# Patient Record
Sex: Male | Born: 1955 | Race: Black or African American | Hispanic: No | Marital: Married | Smoking: Never smoker
Health system: Southern US, Community
[De-identification: ages and names within clinical notes are randomized; demographics above are authoritative.]

## PROBLEM LIST (undated history)

## (undated) DIAGNOSIS — E785 Hyperlipidemia, unspecified: Secondary | ICD-10-CM

## (undated) HISTORY — PX: ROTATOR CUFF REPAIR: SHX139

---

## 2001-05-10 ENCOUNTER — Ambulatory Visit (HOSPITAL_COMMUNITY): Admission: RE | Admit: 2001-05-10 | Discharge: 2001-05-10 | Payer: Self-pay | Admitting: Gastroenterology

## 2006-06-06 ENCOUNTER — Ambulatory Visit (HOSPITAL_COMMUNITY): Admission: RE | Admit: 2006-06-06 | Discharge: 2006-06-06 | Payer: Self-pay | Admitting: Gastroenterology

## 2016-11-08 ENCOUNTER — Encounter (HOSPITAL_BASED_OUTPATIENT_CLINIC_OR_DEPARTMENT_OTHER): Payer: Self-pay | Admitting: *Deleted

## 2016-11-08 ENCOUNTER — Emergency Department (HOSPITAL_BASED_OUTPATIENT_CLINIC_OR_DEPARTMENT_OTHER)
Admission: EM | Admit: 2016-11-08 | Discharge: 2016-11-08 | Disposition: A | Discharge status: Home / Self Care | Attending: Emergency Medicine | Admitting: Emergency Medicine

## 2016-11-08 ENCOUNTER — Emergency Department (HOSPITAL_BASED_OUTPATIENT_CLINIC_OR_DEPARTMENT_OTHER)

## 2016-11-08 DIAGNOSIS — M7989 Other specified soft tissue disorders: Secondary | ICD-10-CM | POA: Diagnosis present

## 2016-11-08 DIAGNOSIS — L03011 Cellulitis of right finger: Secondary | ICD-10-CM | POA: Diagnosis not present

## 2016-11-08 DIAGNOSIS — Z23 Encounter for immunization: Secondary | ICD-10-CM | POA: Insufficient documentation

## 2016-11-08 HISTORY — DX: Hyperlipidemia, unspecified: E78.5

## 2016-11-08 MED ORDER — ACETAMINOPHEN 325 MG PO TABS
650.0000 mg | ORAL_TABLET | Freq: Once | ORAL | Status: AC
Start: 2016-11-08 — End: 2016-11-08
  Administered 2016-11-08: 650 mg via ORAL
  Filled 2016-11-08: qty 2

## 2016-11-08 MED ORDER — TETANUS-DIPHTH-ACELL PERTUSSIS 5-2.5-18.5 LF-MCG/0.5 IM SUSP
0.5000 mL | Freq: Once | INTRAMUSCULAR | Status: AC
  Administered 2016-11-08: 0.5 mL via INTRAMUSCULAR
  Filled 2016-11-08: qty 0.5

## 2016-11-08 MED ORDER — CEPHALEXIN 250 MG PO CAPS
500.0000 mg | ORAL_CAPSULE | Freq: Once | ORAL | Status: AC
  Administered 2016-11-08: 500 mg via ORAL
  Filled 2016-11-08: qty 2

## 2016-11-08 MED ORDER — CEPHALEXIN 500 MG PO CAPS: 500.0000 mg | ORAL_CAPSULE | Freq: Two times a day (BID) | ORAL | 0 refills | Status: AC

## 2016-11-08 MED ORDER — ACETAMINOPHEN 325 MG PO TABS
ORAL_TABLET | ORAL | Status: AC
  Filled 2016-11-08: qty 1

## 2016-11-08 MED ORDER — LIDOCAINE HCL 1 % IJ SOLN
INTRAMUSCULAR | Status: AC
  Filled 2016-11-08: qty 20

## 2016-11-08 MED ORDER — LIDOCAINE HCL (PF) 1 % IJ SOLN: 30.0000 mL | Freq: Once | INTRAMUSCULAR | Status: DC

## 2016-11-08 NOTE — Discharge Instructions (Signed)
Read the information below.  Your x-ray was re-assuring. You had an infection at the base of your nail. It was opened and drained. You are being started on antibiotics, please take as directed. Perform warm soaks for 20 minute increments. Dress with antibiotic ointment and a dressing. You can take tylenol/motrin for pain relief.  Follow up with your primary provider in 2-3 days for wound check.  Return to ED if you develop fever, increasing redness/swelling/warmth, or any other new/concerning symptoms.  Use the prescribed medication as directed.  Please discuss all new medications with your pharmacist.   You may return to the Emergency Department at any time for worsening condition or any new symptoms that concern you.

## 2016-11-08 NOTE — ED Notes (Signed)
ED Provider at bedside. 

## 2016-11-08 NOTE — ED Provider Notes (Signed)
[facility] Note   CSN: [id number] Arrival date & time: 11/08/16  1652  By signing my name below, I, Linna Darner, attest that this documentation has been prepared under the direction and in the presence of Arvilla Meres, PA-C. Electronically Signed: Linna Darner, Scribe. 11/08/2016. 6:45 PM.  History   Chief Complaint Chief Complaint  Patient presents with  . Nail Problem    The history is provided by the patient. No language interpreter was used.     HPI Comments: Francis Reid is a 61 y.o. male who presents to the Emergency Department complaining of constant, worsening, right middle finger pain around the nailbed beginning 2 days ago. He reports associated swelling. He endorses severe pain exacerbation with direct pressure to his right middle finger around the nailbed. He states his pain is shooting into his right hand. Pt has soaked his right middle finger in hot water and has also applied peroxide, alcohol, and ice with minimal relief of his symptoms. No known trauma to his right middle finger. He states he does not bite his nails, but does wash his hands excessively. No h/o immunocompromising conditions. Tetanus status unknown. He denies nausea, vomiting, fever, chills, drainage from the nailbed, or any other associated symptoms.  Past Medical History:  Diagnosis Date  . Hyperlipemia     There are no active problems to display for this patient.   Past Surgical History:  Procedure Laterality Date  . ROTATOR CUFF REPAIR         Home Medications    Prior to Admission medications   Medication Sig Start Date End Date Taking? Authorizing Provider  simvastatin (ZOCOR) 20 MG tablet Take 20 mg by mouth daily.   Yes Historical Provider, MD  cephALEXin (KEFLEX) 500 MG capsule Take 1 capsule (500 mg total) by mouth 2 (two) times daily. 11/08/16   Lona Kettle, PA-C    Family History History reviewed. No pertinent family history.  Social  History Social History  Substance Use Topics  . Smoking status: Never Smoker  . Smokeless tobacco: Not on file  . Alcohol use No     Allergies   Patient has no known allergies.   Review of Systems Review of Systems  Constitutional: Negative for chills and fever.  Gastrointestinal: Negative for nausea and vomiting.  Musculoskeletal: Positive for arthralgias (right middle finger) and joint swelling (right middle finger).  Allergic/Immunologic: Negative for immunocompromised state.  Neurological: Negative for numbness.     Physical Exam Updated Vital Signs BP 145/83 (BP Location: Right Arm)   Pulse 63   Temp 97.8 F (36.6 C)   Resp 18   Ht 5\' 4"  (1.626 m)   Wt 72.6 kg   SpO2 100%   BMI 27.46 kg/m   Physical Exam  Constitutional: He appears well-developed and well-nourished. No distress.  HENT:  Head: Normocephalic and atraumatic.  Eyes: Conjunctivae are normal. No scleral icterus.  Neck: Normal range of motion.  Cardiovascular: Normal rate.   Pulmonary/Chest: Effort normal. No respiratory distress.  Abdominal: He exhibits no distension.  Musculoskeletal: Normal range of motion.  Neurological: He is alert.  Skin: Skin is warm and dry. He is not diaphoretic.  Paronychia to the right 3rd digit. Patient able to make a fist. Sensation is intact. Cap refill intact.   Psychiatric: He has a normal mood and affect. His behavior is normal.     ED Treatments / Results  Labs (all labs ordered are listed, but only abnormal results are displayed)  Labs Reviewed - No data to display  EKG  EKG Interpretation None       Radiology Dg Finger Middle Right  Result Date: 11/08/2016 CLINICAL DATA:  Redness and swelling around the nail of the middle finger EXAM: RIGHT MIDDLE FINGER 2+V COMPARISON:  None. FINDINGS: There is no evidence of fracture or dislocation. There is no evidence of arthropathy or other focal bone abnormality. Soft tissues are unremarkable. IMPRESSION:  Negative. Electronically Signed   By: Jasmine PangKim  Fujinaga M.D.   On: 11/08/2016 19:25    Procedures .Marland Kitchen.Incision and Drainage Date/Time: 11/08/2016 8:29 PM Performed by: Lona KettleMEYER, ASHLEY LAUREL Authorized by: Lona KettleMEYER, ASHLEY LAUREL   Consent:    Consent obtained:  Verbal   Consent given by:  Patient   Risks discussed:  Pain and incomplete drainage   Alternatives discussed:  No treatment Location:    Type:  Abscess   Location:  Upper extremity   Upper extremity location:  Finger   Finger location:  R long finger Pre-procedure details:    Skin preparation:  Betadine Anesthesia (see MAR for exact dosages):    Anesthesia method:  Nerve block   Block anesthetic:  Lidocaine 1% w/o epi   Block outcome:  Anesthesia achieved Procedure type:    Complexity:  Simple Procedure details:    Incision types:  Single straight   Drainage:  Purulent   Drainage amount:  Moderate   Wound treatment:  Wound left open   Packing materials:  None Post-procedure details:    Patient tolerance of procedure:  Tolerated well, no immediate complications    (including critical care time)  DIAGNOSTIC STUDIES: Oxygen Saturation is 98% on RA, normal by my interpretation.    COORDINATION OF CARE: 6:49 PM Discussed treatment plan with pt at bedside and pt agreed to plan.  Medications Ordered in ED Medications  acetaminophen (TYLENOL) tablet 650 mg (650 mg Oral Given 11/08/16 1902)  Tdap (BOOSTRIX) injection 0.5 mL (0.5 mLs Intramuscular Given 11/08/16 1902)  cephALEXin (KEFLEX) capsule 500 mg (500 mg Oral Given 11/08/16 2044)     Initial Impression / Assessment and Plan / ED Course  I have reviewed the triage vital signs and the nursing notes.  Pertinent labs & imaging results that were available during my care of the patient were reviewed by me and considered in my medical decision making (see chart for details).  Clinical Course as of Nov 09 134  Mon Nov 08, 2016  2000 DG Finger Middle Right [AM]    Clinical  Course User Index [AM] Lona Kettleshley Laurel Meyer, [place]PA-C    Patient presents to ED with complaint of right 3rd redness and swelling at nailbed. Patient is afebrile and non-toxic appearing in NAD. VSS. Paronychia at right 3rd digit. Neurovascularly intact. No soft tissue abnormality noted on imaging. Tetanus updated. I&D performed with purulent drainage. ABX initiated. Discussed symptomatic management. Follow up with PCP in 2 days for wound recheck. Return precautions given. Pt voiced understanding and is agreeable.   Final Clinical Impressions(s) / ED Diagnoses   Final diagnoses:  Paronychia of finger, right    New Prescriptions Discharge Medication List as of 11/08/2016  8:36 PM    START taking these medications   Details  cephALEXin (KEFLEX) 500 MG capsule Take 1 capsule (500 mg total) by mouth 2 (two) times daily., Starting Mon 11/08/2016, Print       I personally performed the services described in this documentation, which was scribed in my presence. The recorded information has been reviewed  and is accurate.    Lona Kettle, PA-C 11/09/16 1610    Lavera Guise, MD 11/09/16 [phone]

## 2016-11-08 NOTE — ED Triage Notes (Signed)
Pt c/o right 3rd finger redness and swelling around nail x 3 days

## 2017-05-15 IMAGING — CR DG FINGER MIDDLE 2+V*R*
3 series · 3 of 3 positions shown · non-contrast
Comparison: None.

CLINICAL DATA: Redness and swelling around the nail of the middle
finger

EXAM:
RIGHT MIDDLE FINGER 2+V

[x finger pa right]
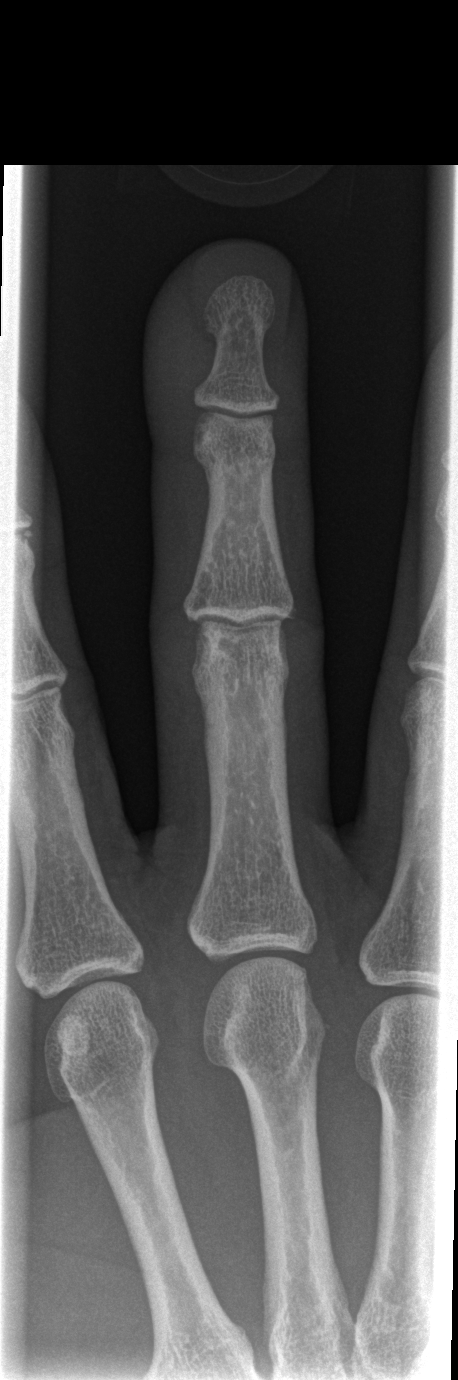

[x finger obl. right]
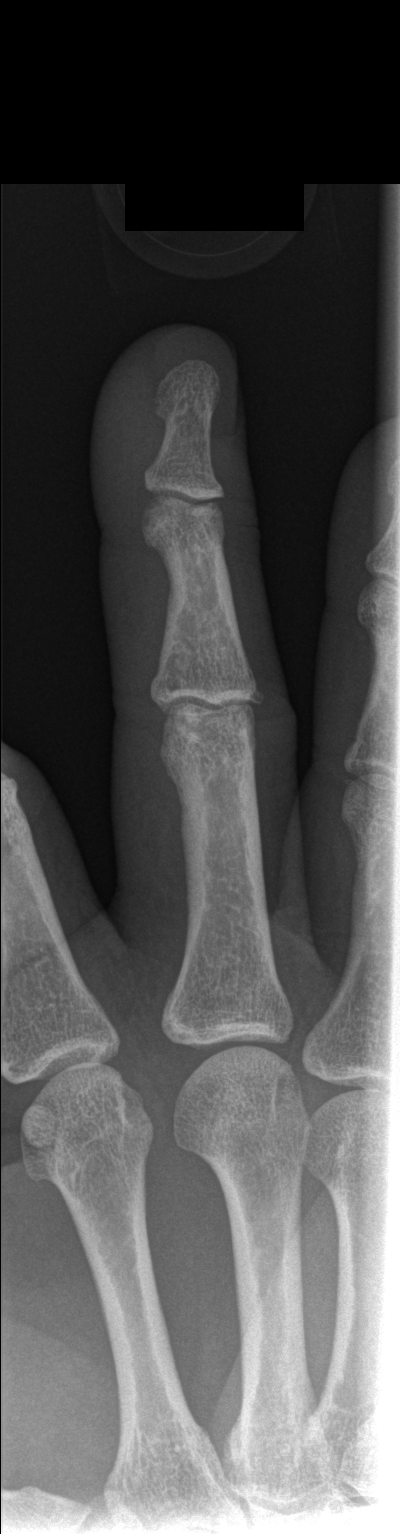

[x finger lateral right]
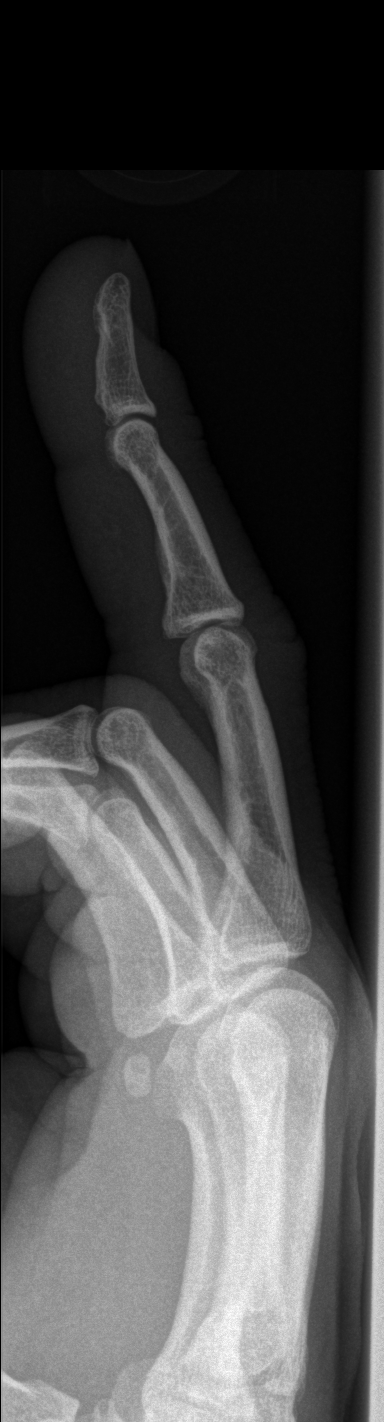

[3 of 3 positions shown; findings below may reference images not displayed]

FINDINGS: There is no evidence of fracture or dislocation. There is no
evidence of arthropathy or other focal bone abnormality. Soft
tissues are unremarkable.
IMPRESSION: Negative.
